# Patient Record
Sex: Female | Born: 2000 | Race: Black or African American | Hispanic: No | Marital: Single | Smoking: Current every day smoker
Health system: Southern US, Community
[De-identification: ages and names within clinical notes are randomized; demographics above are authoritative.]

---
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
## 2021-02-03 IMAGING — CT CT HEAD W/O CM
3 series · 16 of 47 positions shown, 19 images · non-contrast
Comparison: None.

CLINICAL DATA: Headache with questionable seizure

EXAM:
CT HEAD WITHOUT CONTRAST
TECHNIQUE: Contiguous axial images were obtained from the base of the skull
through the vertex without intravenous contrast.

[Series 3: head wo · axial · 0.47mm/px · z∈[-171,-46]mm · 10 of 30 slices shown, 13 images]
[im 3/30  brain]
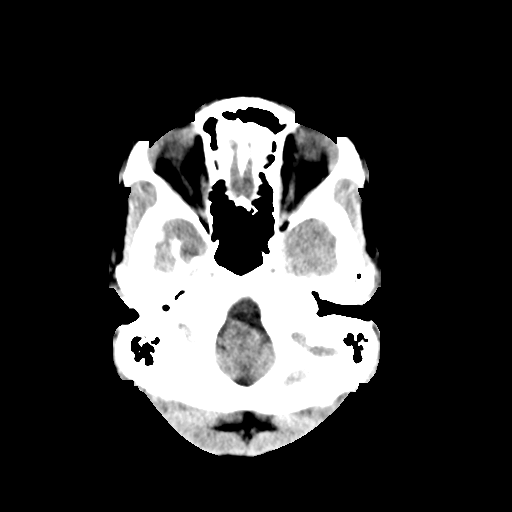
[im 3/30  bone]
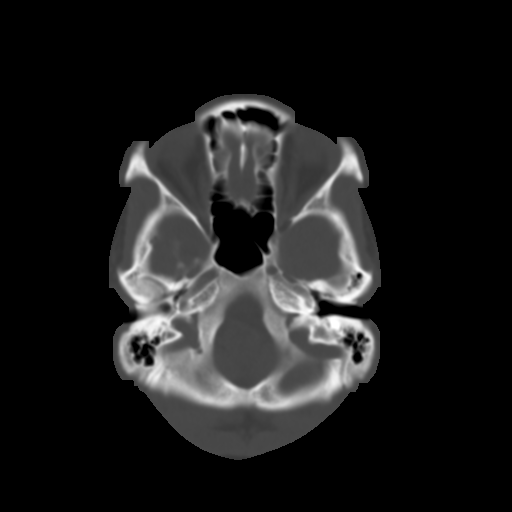
[im 6/30  brain]
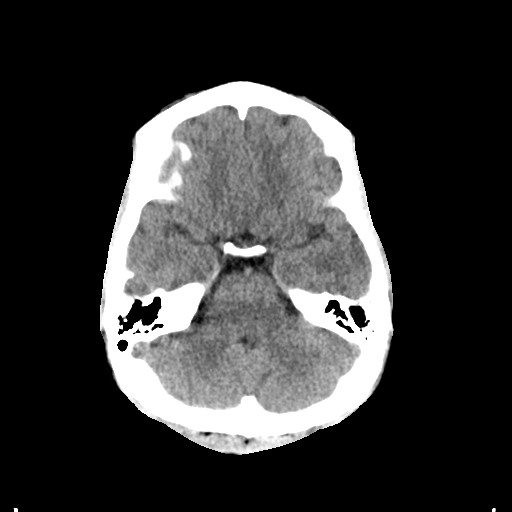
[im 9/30  brain]
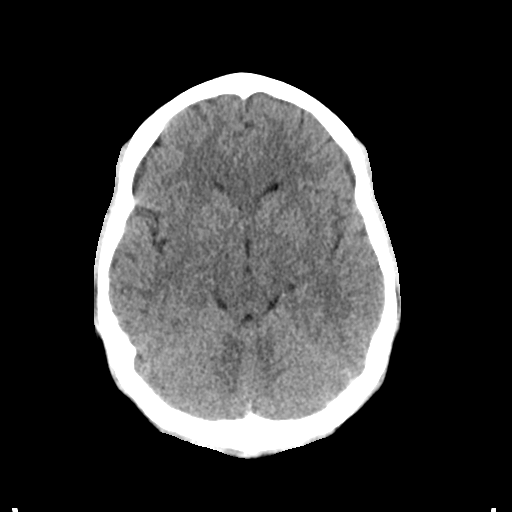
[im 11/30  brain]
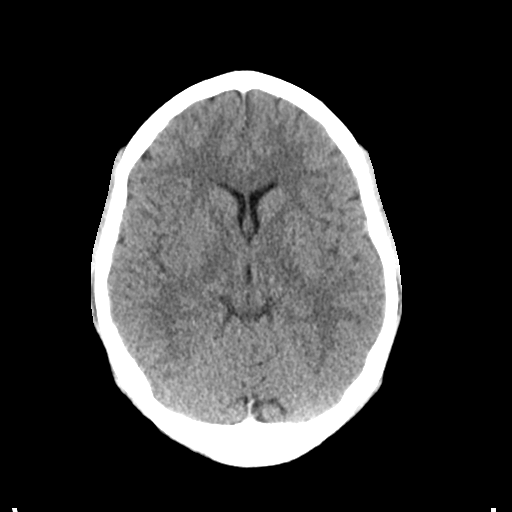
[im 14/30  brain]
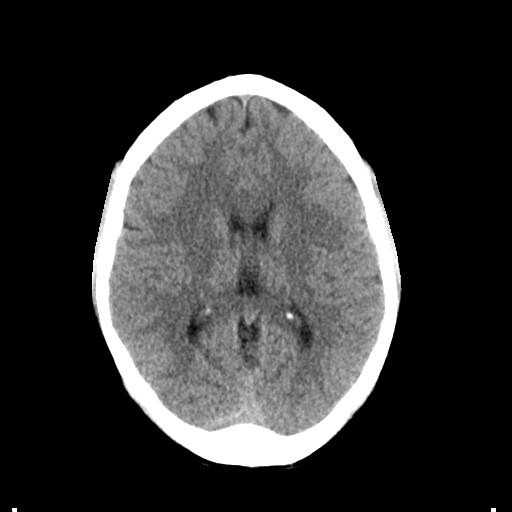
[im 14/30  bone]
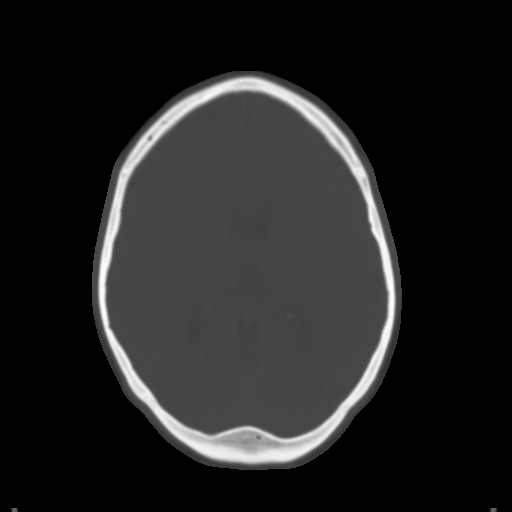
[im 17/30  brain]
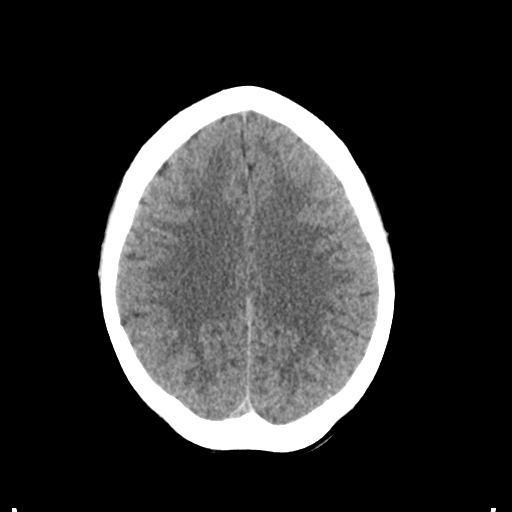
[im 20/30  brain]
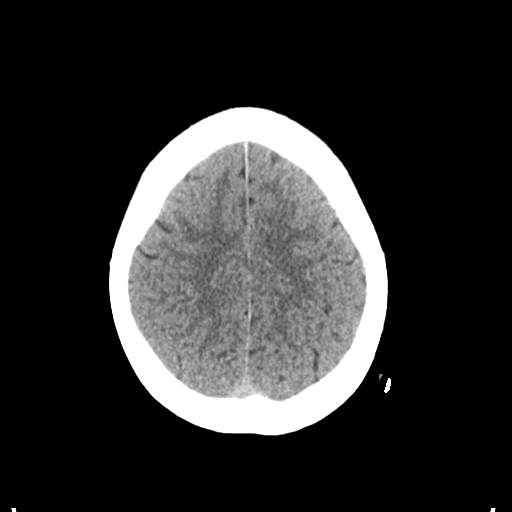
[im 23/30  brain]
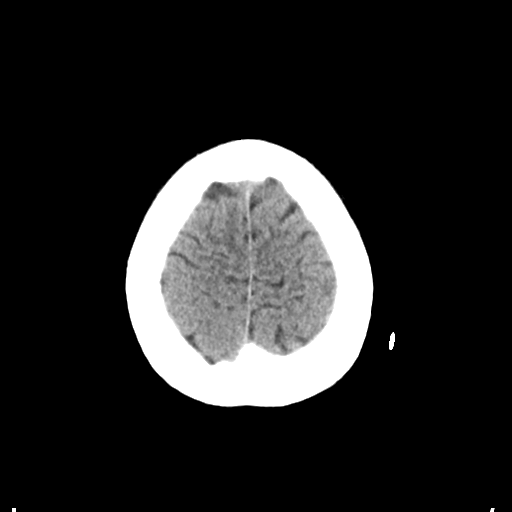
[im 25/30  brain]
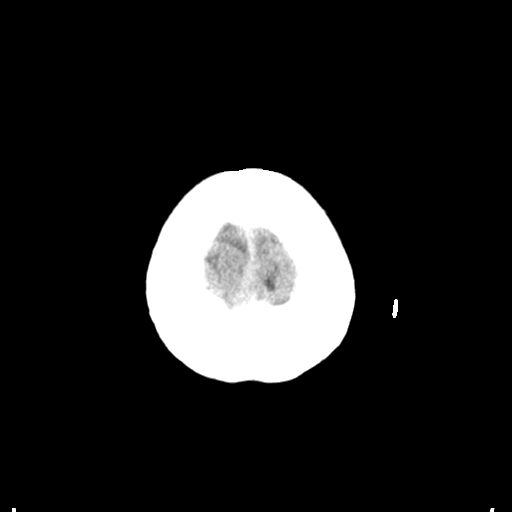
[im 25/30  bone]
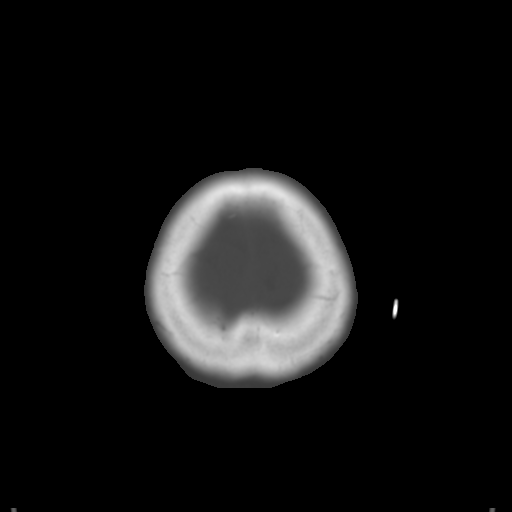
[im 28/30  brain]
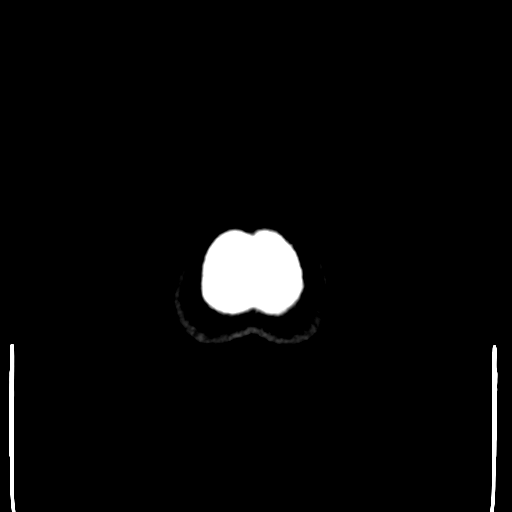

[Series 4: coronal soft tissue · coronal · 0.29mm/px · 3 of 64 slices shown]
[im 22/64  brain]
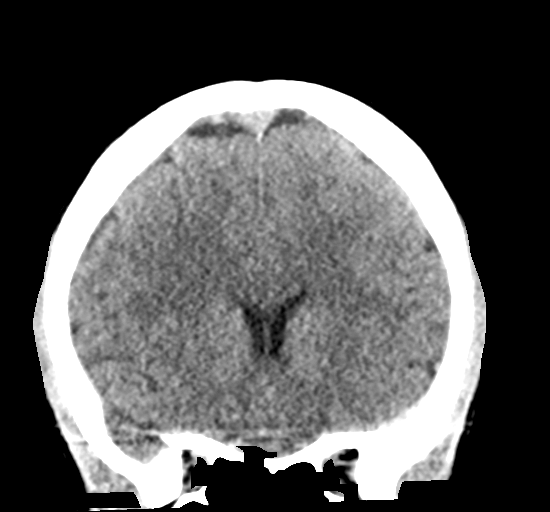
[im 29/64  brain]
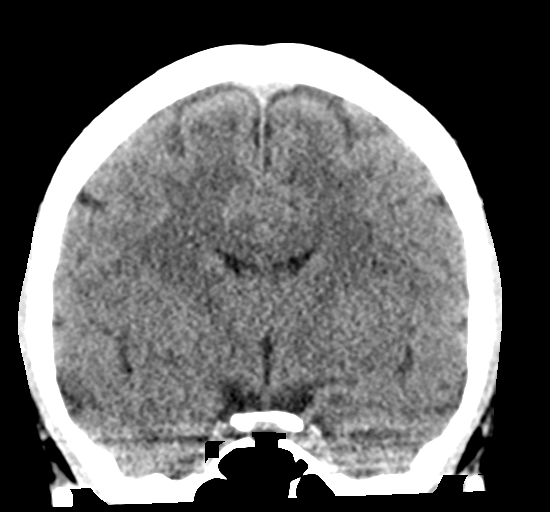
[im 36/64  brain]
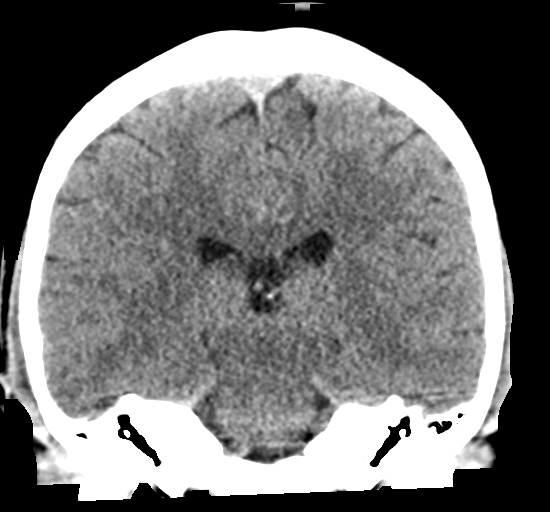

[Series 5: sagittal soft tissue · sagittal · 0.29mm/px · 3 of 54 slices shown]
[im 18/54  brain]
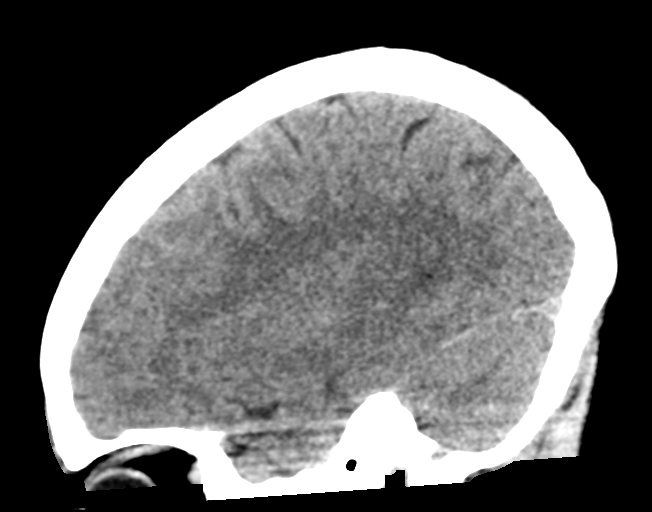
[im 27/54  brain]
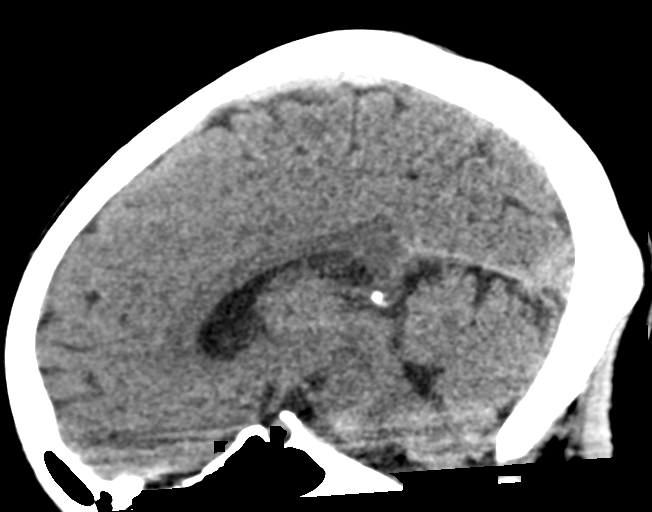
[im 36/54  brain]
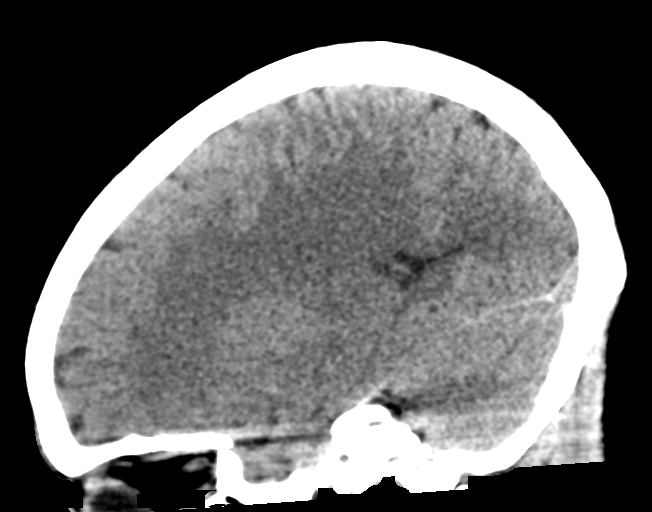

[16 of 47 positions shown; findings below may reference images not displayed]

FINDINGS: Brain: Ventricles and sulci are normal in size and configuration.
There is no intracranial mass, hemorrhage, extra-axial fluid
collection, or midline shift. Brain parenchyma appears unremarkable.
No evident acute infarct.

Vascular: No hyperdense vessel.  No evident vascular calcification.

Skull: Bony calvarium appears intact.

Sinuses/Orbits: Visualized paranasal sinuses are clear. Visualized
orbits appear symmetric bilaterally.

Other: Mastoid air cells are clear.
IMPRESSION: Study within normal limits.
